# Patient Record
Sex: Female | Born: 2000
Health system: Southern US, Community
[De-identification: ages and names within clinical notes are randomized; demographics above are authoritative.]

## PROBLEM LIST (undated history)

## (undated) DIAGNOSIS — B009 Herpesviral infection, unspecified: Secondary | ICD-10-CM

## (undated) DIAGNOSIS — R070 Pain in throat: Secondary | ICD-10-CM

## (undated) DIAGNOSIS — H6690 Otitis media, unspecified, unspecified ear: Secondary | ICD-10-CM

## (undated) HISTORY — PX: WISDOM TOOTH EXTRACTION: SHX21

## (undated) HISTORY — DX: Pain in throat: R07.0

## (undated) HISTORY — DX: Otitis media, unspecified, unspecified ear: H66.90

## (undated) HISTORY — DX: Herpesviral infection, unspecified: B00.9

---
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
## 2022-01-26 NOTE — Progress Notes (Signed)
   Complete physical exam  Patient: Teresa Howell   DOB: 04/26/1997   21 y.o. Female  MRN: [id number]  Subjective:    No chief complaint on file.   Teresa Howell is a 21 y.o. female who presents today for a complete physical exam. She reports consuming a {diet types:17450} diet. {types:19826} She generally feels {DESC; WELL/FAIRLY WELL/POORLY:18703}. She reports sleeping {DESC; WELL/FAIRLY WELL/POORLY:18703}. She {does/does not:200015} have additional problems to discuss today.    Most recent fall risk assessment:    01/01/2022   10:42 AM  Fall Risk   Falls in the past year? 0  Number falls in past yr: 0  Injury with Fall? 0  Risk for fall due to : No Fall Risks  Follow up Falls evaluation completed     Most recent depression screenings:    01/01/2022   10:42 AM 11/22/2020   10:46 AM  PHQ 2/9 Scores  PHQ - 2 Score 0 0  PHQ- 9 Score 5     {VISON DENTAL STD PSA (Optional):27386}  {History (Optional):23778}  Patient Care Team: Analyse Angst, NP as PCP - General (Nurse Practitioner)   Outpatient Medications Prior to Visit  Medication Sig   fluticasone (FLONASE) 50 MCG/ACT nasal spray Place 2 sprays into both nostrils in the morning and at bedtime. After 7 days, reduce to once daily.   norgestimate-ethinyl estradiol (SPRINTEC 28) 0.25-35 MG-MCG tablet Take 1 tablet by mouth daily.   Nystatin POWD Apply liberally to affected area 2 times per day   spironolactone (ALDACTONE) 100 MG tablet Take 1 tablet (100 mg total) by mouth daily.   No facility-administered medications prior to visit.    ROS        Objective:     There were no vitals taken for this visit. {Vitals History (Optional):23777}  Physical Exam   No results found for any visits on 02/06/22. {Show previous labs (optional):23779}    Assessment & Plan:    Routine Health Maintenance and Physical Exam  Immunization History  Administered Date(s) Administered   DTaP 07/10/1999, 09/05/1999,  11/14/1999, 07/30/2000, 02/13/2004   Hepatitis A 12/10/2007, 12/15/2008   Hepatitis B 04/27/1999, 06/04/1999, 11/14/1999   HiB (PRP-OMP) 07/10/1999, 09/05/1999, 11/14/1999, 07/30/2000   IPV 07/10/1999, 09/05/1999, 05/04/2000, 02/13/2004   Influenza,inj,Quad PF,6+ Mos 03/17/2014   Influenza-Unspecified 06/16/2012   MMR 05/04/2001, 02/13/2004   Meningococcal Polysaccharide 12/15/2011   Pneumococcal Conjugate-13 07/30/2000   Pneumococcal-Unspecified 11/14/1999, 01/28/2000   Tdap 12/15/2011   Varicella 05/04/2000, 12/10/2007    Health Maintenance  Topic Date Due   HIV Screening  Never done   Hepatitis C Screening  Never done   INFLUENZA VACCINE  02/04/2022   PAP-Cervical Cytology Screening  02/06/2022 (Originally 04/25/2020)   PAP SMEAR-Modifier  02/06/2022 (Originally 04/25/2020)   TETANUS/TDAP  02/06/2022 (Originally 12/14/2021)   HPV VACCINES  Discontinued   COVID-19 Vaccine  Discontinued    Discussed health benefits of physical activity, and encouraged her to engage in regular exercise appropriate for her age and condition.  Problem List Items Addressed This Visit   None Visit Diagnoses     Annual physical exam    -  Primary   Cervical cancer screening       Need for Tdap vaccination          No follow-ups on file.     Fayetta Sorenson, NP   

## 2022-01-27 ENCOUNTER — Ambulatory Visit: Payer: Self-pay | Admitting: Medical-Surgical

## 2022-01-27 DIAGNOSIS — Z91199 Patient's noncompliance with other medical treatment and regimen due to unspecified reason: Secondary | ICD-10-CM

## 2022-01-27 DIAGNOSIS — F418 Other specified anxiety disorders: Secondary | ICD-10-CM

## 2022-03-04 ENCOUNTER — Ambulatory Visit: Payer: No Typology Code available for payment source | Admitting: Medical-Surgical

## 2022-03-20 ENCOUNTER — Encounter: Payer: Self-pay | Admitting: Medical-Surgical

## 2022-03-20 ENCOUNTER — Ambulatory Visit (INDEPENDENT_AMBULATORY_CARE_PROVIDER_SITE_OTHER): Payer: No Typology Code available for payment source | Admitting: Medical-Surgical

## 2022-03-20 ENCOUNTER — Other Ambulatory Visit (HOSPITAL_BASED_OUTPATIENT_CLINIC_OR_DEPARTMENT_OTHER): Payer: Self-pay

## 2022-03-20 VITALS — BP 93/70 | HR 89 | Resp 20 | Ht 67.49 in | Wt 224.3 lb

## 2022-03-20 DIAGNOSIS — F418 Other specified anxiety disorders: Secondary | ICD-10-CM | POA: Diagnosis not present

## 2022-03-20 DIAGNOSIS — B009 Herpesviral infection, unspecified: Secondary | ICD-10-CM

## 2022-03-20 DIAGNOSIS — Z23 Encounter for immunization: Secondary | ICD-10-CM

## 2022-03-20 MED ORDER — VALACYCLOVIR HCL 1 G PO TABS
1000.0000 mg | ORAL_TABLET | Freq: Two times a day (BID) | ORAL | 1 refills | Status: DC | PRN
  Filled 2022-03-20: qty 30, 15d supply, fill #0

## 2022-03-20 MED ORDER — ESCITALOPRAM OXALATE 10 MG PO TABS
ORAL_TABLET | ORAL | 1 refills | Status: DC
  Filled 2022-03-20: qty 30, 30d supply, fill #0

## 2022-03-20 NOTE — Progress Notes (Signed)
Established Patient Office Visit  Subjective   Patient ID: Teresa Howell, female    DOB: 2001-02-05  Age: 21 y.o. MRN: [id number]  Chief Complaint  Patient presents with   Follow-up    Teresa Howell is a very pleasant 21 year old female here today for follow up after starting her on Lexapro back in June. She stated that she has not been taking it at prescribed, she said that she took it maybe the first two weeks and sporadically afterwards. She stated it suppresses her appetite, but otherwise manageable. She said she does not feel any better or worst since last visit. At this visit, she is also asking for Valacyclovir refill for HSV flare up, she states she takes it twice daily until symptoms are resolved usually about 4 days.       Review of Systems  Constitutional: Negative.   Respiratory: Negative.    Cardiovascular: Negative.   Neurological: Negative.   Psychiatric/Behavioral:  Positive for depression.       Objective:     BP 93/70 (BP Location: Left Arm, Cuff Size: Normal)   Pulse 89   Resp 20   Ht 5' 7.49" (1.714 m)   Wt 101.7 kg   SpO2 98%   BMI 34.62 kg/m    Physical Exam Constitutional:      Appearance: Normal appearance. She is obese.  HENT:     Head: Normocephalic and atraumatic.  Cardiovascular:     Rate and Rhythm: Normal rate and regular rhythm.  Pulmonary:     Effort: Pulmonary effort is normal. No respiratory distress.  Skin:    General: Skin is warm and dry.  Neurological:     General: No focal deficit present.     Mental Status: She is alert and oriented to person, place, and time. Mental status is at baseline.  Psychiatric:        Mood and Affect: Mood normal.        Behavior: Behavior normal.        Thought Content: Thought content normal.        Judgment: Judgment normal.      No results found for any visits on 03/20/22.    The ASCVD Risk score (Arnett DK, et al., 2019) failed to calculate for the following reasons:   The 2019 ASCVD  risk score is only valid for ages 67 to 39    Assessment & Plan:   Problem List Items Addressed This Visit       Other   HSV-1 infection    Valacyclovir (Valtrex) 1000 mg, twice daily until symptom / flare up resolves.       Relevant Medications   valACYclovir (VALTREX) 1000 MG tablet   HSV-2 infection    Valacyclovir (Valtrex) 1000 mg, twice daily until symptom / flare up resolves.       Relevant Medications   valACYclovir (VALTREX) 1000 MG tablet   Depression with anxiety    We discussed with Teresa Howell at length the importance of taking medications as prescribed. She is willing to restart it and teaching on how to improve compliance was given such as setting an alarm, placing the medication where it is visible. Instructed patient that it might take at least 4-6 weeks of consistent dosing to start seeing positive effects. Teaching regarding importance of exercise was provided as well.       Relevant Medications   escitalopram (LEXAPRO) 10 MG tablet   Other Visit Diagnoses     Need for  influenza vaccination    -  Primary   Relevant Orders   Flu Vaccine QUAD 6+ mos PF IM (Fluarix Quad PF) (Completed)       Return in about 6 weeks (around 05/01/2022), or Mood / Medication Follow up.    Loretha Stapler, RN

## 2022-03-20 NOTE — Progress Notes (Signed)
See below for student documentation and medical provider attestation. ___________________________________________ Thayer Ohm, DNP, APRN, FNP-BC Primary Care and Sports Medicine [facility] [place]

## 2022-03-20 NOTE — Assessment & Plan Note (Signed)
Valacyclovir (Valtrex) 1000 mg, twice daily until symptom / flare up resolves.  

## 2022-03-20 NOTE — Assessment & Plan Note (Signed)
We discussed with [name] at length the importance of taking medications as prescribed. She is willing to restart it and teaching on how to improve compliance was given such as setting an alarm, placing the medication where it is visible. Instructed patient that it might take at least 4-6 weeks of consistent dosing to start seeing positive effects. Teaching regarding importance of exercise was provided as well.

## 2022-03-20 NOTE — Assessment & Plan Note (Signed)
Valacyclovir (Valtrex) 1000 mg, twice daily until symptom / flare up resolves.

## 2022-04-23 ENCOUNTER — Other Ambulatory Visit (HOSPITAL_BASED_OUTPATIENT_CLINIC_OR_DEPARTMENT_OTHER): Payer: Self-pay

## 2022-04-23 ENCOUNTER — Telehealth: Payer: No Typology Code available for payment source | Admitting: Nurse Practitioner

## 2022-04-23 DIAGNOSIS — J0100 Acute maxillary sinusitis, unspecified: Secondary | ICD-10-CM | POA: Diagnosis not present

## 2022-04-23 MED ORDER — AMOXICILLIN-POT CLAVULANATE 875-125 MG PO TABS
1.0000 | ORAL_TABLET | Freq: Two times a day (BID) | ORAL | 0 refills | Status: DC
  Filled 2022-04-23: qty 14, 7d supply, fill #0

## 2022-04-23 NOTE — Progress Notes (Signed)

## 2022-04-29 ENCOUNTER — Telehealth: Payer: No Typology Code available for payment source | Admitting: Physician Assistant

## 2022-04-29 DIAGNOSIS — H539 Unspecified visual disturbance: Secondary | ICD-10-CM

## 2022-04-29 DIAGNOSIS — J0110 Acute frontal sinusitis, unspecified: Secondary | ICD-10-CM

## 2022-04-29 NOTE — Progress Notes (Signed)
Because of continued and progressing symptoms despite treatment via e-visit, and vision changes, I feel your condition warrants further evaluation and I recommend that you be seen in a face to face visit.   NOTE: There will be NO CHARGE for this eVisit   If you are having a true medical emergency please call 911.      For an urgent face to face visit, [place] has seven urgent care centers for your convenience:     [facility] [place] at [facility] Get Driving Directions [phone] [place] [place], [place] [address]    [facility]) Get Driving Directions [phone] [place], [place] [address]  [facility] [place] ([place]) Get Driving Directions [phone] [address] [place] [address],  [place]  [address]  [facility] [place] [facility] - at [address] Get Driving Directions  [phone] [address] [place],  [place] [address]   [facility] Get Driving Directions [phone] [place], [place], [place] [address]   [facility] Get Driving Directions  [phone] [address] [place], [place] [address]   [facility] [place] Get Driving Directions [phone] [address], [place], [place] [address]  Your MyChart E-visit questionnaire answers were reviewed by a board certified advanced clinical practitioner to complete your personal care plan based on your specific symptoms.  Thank you for using e-Visits.

## 2022-05-05 NOTE — Progress Notes (Unsigned)
   Established Patient Office Visit  Subjective   Patient ID: Teresa Howell, female   DOB: 04/23/2001 Age: 21 y.o. MRN: [id number]   No chief complaint on file.   HPI    Objective:    There were no vitals filed for this visit.  Physical Exam   No results found for this or any previous visit (from the past 24 hour(s)).   {Labs (Optional):23779}  The ASCVD Risk score (Arnett DK, et al., 2019) failed to calculate for the following reasons:   The 2019 ASCVD risk score is only valid for ages 90 to 58   Assessment & Plan:   No problem-specific Assessment & Plan notes found for this encounter.   No follow-ups on file.  ___________________________________________ Thayer Ohm, DNP, APRN, FNP-BC Primary Care and Sports Medicine [facility]

## 2022-05-06 ENCOUNTER — Ambulatory Visit (INDEPENDENT_AMBULATORY_CARE_PROVIDER_SITE_OTHER): Payer: No Typology Code available for payment source | Admitting: Medical-Surgical

## 2022-05-06 DIAGNOSIS — F418 Other specified anxiety disorders: Secondary | ICD-10-CM

## 2022-05-06 DIAGNOSIS — Z91199 Patient's noncompliance with other medical treatment and regimen due to unspecified reason: Secondary | ICD-10-CM

## 2022-07-08 ENCOUNTER — Ambulatory Visit (INDEPENDENT_AMBULATORY_CARE_PROVIDER_SITE_OTHER): Payer: 59 | Admitting: Medical-Surgical

## 2022-07-08 ENCOUNTER — Encounter: Payer: Self-pay | Admitting: Medical-Surgical

## 2022-07-08 ENCOUNTER — Other Ambulatory Visit (HOSPITAL_BASED_OUTPATIENT_CLINIC_OR_DEPARTMENT_OTHER): Payer: Self-pay

## 2022-07-08 VITALS — BP 122/73 | HR 96 | Resp 20 | Ht 67.5 in | Wt 227.9 lb

## 2022-07-08 DIAGNOSIS — Z111 Encounter for screening for respiratory tuberculosis: Secondary | ICD-10-CM | POA: Diagnosis not present

## 2022-07-08 DIAGNOSIS — Z13220 Encounter for screening for lipoid disorders: Secondary | ICD-10-CM | POA: Diagnosis not present

## 2022-07-08 DIAGNOSIS — Z0000 Encounter for general adult medical examination without abnormal findings: Secondary | ICD-10-CM | POA: Diagnosis not present

## 2022-07-08 MED ORDER — ESCITALOPRAM OXALATE 10 MG PO TABS
10.0000 mg | ORAL_TABLET | Freq: Every day | ORAL | 1 refills | Status: DC
  Filled 2022-07-08: qty 90, 90d supply, fill #0

## 2022-07-08 NOTE — Patient Instructions (Signed)
3

## 2022-07-08 NOTE — Progress Notes (Signed)
Complete physical exam  Patient: Teresa Howell   DOB: Dec 27, 2000   22 y.o. Female  MRN: [id number]  Subjective:    Chief Complaint  Patient presents with   Annual Exam    TB Skin test for work    Teresa Howell is a 22 y.o. female who presents today for a complete physical exam. She reports consuming a general diet. The patient does not participate in regular exercise at present. She generally feels well. She reports sleeping well. She does not have additional problems to discuss today.    Most recent fall risk assessment:    03/20/2022    2:15 PM  Fall Risk   Falls in the past year? 0  Number falls in past yr: 0  Injury with Fall? 0  Risk for fall due to : No Fall Risks  Follow up Falls evaluation completed     Most recent depression screenings:    03/20/2022    2:15 PM 12/09/2021    3:18 PM  PHQ 2/9 Scores  PHQ - 2 Score 1 2  PHQ- 9 Score 7 16    Vision:Not within last year , Dental: No current dental problems and Receives regular dental care, and STD: The patient reports a past history of: herpes    Patient Care Team: Samuel Bouche, NP as PCP - General (Nurse Practitioner)   Outpatient Medications Prior to Visit  Medication Sig   levonorgestrel (KYLEENA) 19.5 MG IUD 1 each by Intrauterine route once.   meloxicam (MOBIC) 7.5 MG tablet Take 7.5 mg by mouth as needed for pain. For menstrual pain   valACYclovir (VALTREX) 1000 MG tablet Take 1 tablet (1,000 mg total) by mouth 2 (two) times daily as needed.   [DISCONTINUED] escitalopram (LEXAPRO) 10 MG tablet Take 1/2 tablet (5mg ) by mouth daily for 8 days then increase to 1 full tablet (10mg ) daily.   [DISCONTINUED] amoxicillin-clavulanate (AUGMENTIN) 875-125 MG tablet Take 1 tablet by mouth 2 (two) times daily. (Patient not taking: Reported on 07/08/2022)   No facility-administered medications prior to visit.    Review of Systems  Constitutional:  Negative for chills, fever, malaise/fatigue and weight loss.   HENT:  Negative for congestion, ear pain, hearing loss, sinus pain and sore throat.   Eyes:  Negative for blurred vision, photophobia and pain.  Respiratory:  Negative for cough, shortness of breath and wheezing.   Cardiovascular:  Negative for chest pain, palpitations and leg swelling.  Gastrointestinal:  Negative for abdominal pain, constipation, diarrhea, heartburn, nausea and vomiting.  Genitourinary:  Negative for dysuria, frequency and urgency.  Musculoskeletal:  Negative for falls and neck pain.  Skin:  Negative for itching and rash.  Neurological:  Negative for dizziness, weakness and headaches.  Endo/Heme/Allergies:  Negative for polydipsia. Does not bruise/bleed easily.  Psychiatric/Behavioral:  Negative for depression, substance abuse and suicidal ideas. The patient is not nervous/anxious.       Objective:    BP 122/73 (BP Location: Left Arm, Cuff Size: Normal)   Pulse 96   Resp 20   Ht 5' 7.5" (1.715 m)   Wt 227 lb 14.4 oz (103.4 kg)   SpO2 99%   BMI 35.17 kg/m    Physical Exam Constitutional:      General: She is not in acute distress.    Appearance: Normal appearance. She is not ill-appearing.  HENT:     Head: Normocephalic and atraumatic.     Right Ear: Tympanic membrane, ear canal and external ear normal.  There is no impacted cerumen.     Left Ear: Tympanic membrane, ear canal and external ear normal. There is no impacted cerumen.     Nose: Nose normal. No congestion or rhinorrhea.     Mouth/Throat:     Mouth: Mucous membranes are moist.     Pharynx: No oropharyngeal exudate or posterior oropharyngeal erythema.  Eyes:     General: No scleral icterus.       Right eye: No discharge.        Left eye: No discharge.     Extraocular Movements: Extraocular movements intact.     Conjunctiva/sclera: Conjunctivae normal.     Pupils: Pupils are equal, round, and reactive to light.  Neck:     Thyroid: No thyromegaly.     Vascular: No carotid bruit or JVD.      Trachea: Trachea normal.  Cardiovascular:     Rate and Rhythm: Normal rate and regular rhythm.     Pulses: Normal pulses.     Heart sounds: Normal heart sounds. No murmur heard.    No friction rub. No gallop.  Pulmonary:     Effort: Pulmonary effort is normal. No respiratory distress.     Breath sounds: Normal breath sounds. No wheezing.  Abdominal:     General: Bowel sounds are normal. There is no distension.     Palpations: Abdomen is soft.     Tenderness: There is no abdominal tenderness. There is no guarding.  Musculoskeletal:        General: Normal range of motion.     Cervical back: Normal range of motion and neck supple.  Skin:    General: Skin is warm and dry.  Neurological:     Mental Status: She is alert and oriented to person, place, and time.     Cranial Nerves: No cranial nerve deficit.  Psychiatric:        Mood and Affect: Mood normal.        Behavior: Behavior normal.        Thought Content: Thought content normal.        Judgment: Judgment normal.   No results found for any visits on 07/08/22.     Assessment & Plan:    Routine Health Maintenance and Physical Exam  Immunization History  Administered Date(s) Administered   Hepatitis A, Ped/Adol-2 Dose 03/23/2015   Influenza,inj,Quad PF,6+ Mos 07/13/2013, 08/18/2016, 04/18/2020, 03/20/2022   Influenza-Unspecified 07/13/2013, 08/18/2016, 04/03/2018   Meningococcal B, OMV 02/23/2020, 04/18/2020   Meningococcal Conjugate 03/23/2015, 02/23/2020   Tdap 03/23/2015    Health Maintenance  Topic Date Due   COVID-19 Vaccine (1) Never done   CHLAMYDIA SCREENING  Never done   DTaP/Tdap/Td (2 - Td or Tdap) 03/22/2025   INFLUENZA VACCINE  Completed   HPV VACCINES  Discontinued   Hepatitis C Screening  Discontinued   HIV Screening  Discontinued    Discussed health benefits of physical activity, and encouraged her to engage in regular exercise appropriate for her age and condition.  1. Annual physical  exam Checking labs as below.  Up-to-date on preventative care.  Wellness information provided with AVS. - CBC with Differential/Platelet - COMPLETE METABOLIC PANEL WITH GFR - Lipid panel  2. Screening-pulmonary TB Adding QuantiFERON to labs today. - QuantiFERON-TB Gold Plus  3. Lipid screening Checking lipid panel. - Lipid panel   Return in about 1 year (around 07/09/2023) for annual physical exam or sooner if needed.   Samuel Bouche, NP

## 2022-07-09 NOTE — Telephone Encounter (Signed)
Printed and given to [employer]

## 2022-07-10 LAB — CBC WITH DIFFERENTIAL/PLATELET
Absolute Monocytes: 540 cells/uL (ref 200–950)
Basophils Absolute: 51 cells/uL (ref 0–200)
Basophils Relative: 0.7 %
Eosinophils Absolute: 292 cells/uL (ref 15–500)
Eosinophils Relative: 4 %
HCT: 37.5 % (ref 35.0–45.0)
Hemoglobin: 12.6 g/dL (ref 11.7–15.5)
Lymphs Abs: 2394 cells/uL (ref 850–3900)
MCH: 29 pg (ref 27.0–33.0)
MCHC: 33.6 g/dL (ref 32.0–36.0)
MCV: 86.4 fL (ref 80.0–100.0)
MPV: 10.3 fL (ref 7.5–12.5)
Monocytes Relative: 7.4 %
Neutro Abs: 4022 cells/uL (ref 1500–7800)
Neutrophils Relative %: 55.1 %
Platelets: 365 10*3/uL (ref 140–400)
RBC: 4.34 10*6/uL (ref 3.80–5.10)
RDW: 12.2 % (ref 11.0–15.0)
Total Lymphocyte: 32.8 %
WBC: 7.3 10*3/uL (ref 3.8–10.8)

## 2022-07-10 LAB — COMPLETE METABOLIC PANEL WITH GFR
AG Ratio: 1.6 (calc) (ref 1.0–2.5)
ALT: 10 U/L (ref 5–32)
AST: 15 U/L (ref 12–32)
Albumin: 4.4 g/dL (ref 3.6–5.1)
Alkaline phosphatase (APISO): 72 U/L (ref 36–128)
BUN: 9 mg/dL (ref 7–20)
CO2: 28 mmol/L (ref 20–32)
Calcium: 8.8 mg/dL — ABNORMAL LOW (ref 8.9–10.4)
Chloride: 104 mmol/L (ref 98–110)
Creat: 0.73 mg/dL (ref 0.50–0.96)
Globulin: 2.8 g/dL (calc) (ref 2.0–3.8)
Glucose, Bld: 82 mg/dL (ref 65–99)
Potassium: 4 mmol/L (ref 3.8–5.1)
Sodium: 140 mmol/L (ref 135–146)
Total Bilirubin: 0.4 mg/dL (ref 0.2–1.1)
Total Protein: 7.2 g/dL (ref 6.3–8.2)
eGFR: 121 mL/min/{1.73_m2} (ref >=60)

## 2022-07-10 LAB — QUANTIFERON-TB GOLD PLUS
Mitogen-NIL: >10 IU/mL
NIL: 0.01 IU/mL
QuantiFERON-TB Gold Plus: NEGATIVE
TB1-NIL: 0 IU/mL
TB2-NIL: 0 IU/mL

## 2022-07-10 LAB — LIPID PANEL
Cholesterol: 160 mg/dL (ref <170)
HDL: 61 mg/dL (ref >45)
LDL Cholesterol (Calc): 82 mg/dL (calc) (ref <110)
Non-HDL Cholesterol (Calc): 99 mg/dL (calc) (ref <120)
Total CHOL/HDL Ratio: 2.6 (calc) (ref <5.0)
Triglycerides: 89 mg/dL (ref <90)

## 2022-07-10 NOTE — Telephone Encounter (Signed)
Printed TB lab results and attached to the form.   LMVM and sent message through MyChart advising the patient that her form is ready for pick up at the front desk.

## 2022-07-10 NOTE — Progress Notes (Signed)
Printed out QuantiFERON-TB  Gold Plus results and attached to completed form.  LMVM and sent a message through MyChart advising the patient that her form is ready for pick up.

## 2022-07-17 ENCOUNTER — Other Ambulatory Visit (HOSPITAL_BASED_OUTPATIENT_CLINIC_OR_DEPARTMENT_OTHER): Payer: Self-pay

## 2022-12-10 ENCOUNTER — Telehealth: Payer: 59 | Admitting: Nurse Practitioner

## 2022-12-10 ENCOUNTER — Other Ambulatory Visit (HOSPITAL_BASED_OUTPATIENT_CLINIC_OR_DEPARTMENT_OTHER): Payer: Self-pay

## 2022-12-10 DIAGNOSIS — R112 Nausea with vomiting, unspecified: Secondary | ICD-10-CM

## 2022-12-10 MED ORDER — ONDANSETRON 4 MG PO TBDP
4.0000 mg | ORAL_TABLET | Freq: Three times a day (TID) | ORAL | 0 refills | Status: DC | PRN
  Filled 2022-12-10: qty 20, 7d supply, fill #0

## 2022-12-10 NOTE — Progress Notes (Signed)
E-Visit for Nausea and Vomiting   We are sorry that you are not feeling well. Here is how we plan to help!  Based on what you have shared with me it looks like you have a Virus that is irritating your GI tract.  Vomiting is the forceful emptying of a portion of the stomach's content through the mouth.  Although nausea and vomiting can make you feel miserable, it's important to remember that these are not diseases, but rather symptoms of an underlying illness.  When we treat short term symptoms, we always caution that any symptoms that persist should be fully evaluated in a medical office.  I have prescribed a medication that will help alleviate your symptoms and allow you to stay hydrated:  Zofran 4 mg 1 tablet every 8 hours as needed for nausea and vomiting  HOME CARE: Drink clear liquids.  This is very important! Dehydration (the lack of fluid) can lead to a serious complication.  Start off with 1 tablespoon every 5 minutes for 8 hours. You may begin eating bland foods after 8 hours without vomiting.  Start with saltine crackers, white bread, rice, mashed potatoes, applesauce. After 48 hours on a bland diet, you may resume a normal diet. Try to go to sleep.  Sleep often empties the stomach and relieves the need to vomit.  GET HELP RIGHT AWAY IF:  Your symptoms do not improve or worsen within 2 days after treatment. You have a fever for over 3 days. You cannot keep down fluids after trying the medication.  MAKE SURE YOU:  Understand these instructions. Will watch your condition. Will get help right away if you are not doing well or get worse.    Thank you for choosing an e-visit.  Your e-visit answers were reviewed by a board certified advanced clinical practitioner to complete your personal care plan. Depending upon the condition, your plan could have included both over the counter or prescription medications.  Please review your pharmacy choice. Make sure the pharmacy is open so  you can pick up prescription now. If there is a problem, you may contact your provider through MyChart messaging and have the prescription routed to another pharmacy.  Your safety is important to us. If you have drug allergies check your prescription carefully.   For the next 24 hours you can use MyChart to ask questions about today's visit, request a non-urgent call back, or ask for a work or school excuse. You will get an email in the next two days asking about your experience. I hope that your e-visit has been valuable and will speed your recovery.   Meds ordered this encounter  Medications   ondansetron (ZOFRAN-ODT) 4 MG disintegrating tablet    Sig: Take 1 tablet (4 mg total) by mouth every 8 (eight) hours as needed for nausea or vomiting.    Dispense:  20 tablet    Refill:  0    I spent approximately 5 minutes reviewing the patient's history, current symptoms and coordinating their care today.   

## 2022-12-31 ENCOUNTER — Encounter: Payer: Self-pay | Admitting: Medical-Surgical

## 2022-12-31 ENCOUNTER — Other Ambulatory Visit (HOSPITAL_BASED_OUTPATIENT_CLINIC_OR_DEPARTMENT_OTHER): Payer: Self-pay

## 2022-12-31 ENCOUNTER — Ambulatory Visit (INDEPENDENT_AMBULATORY_CARE_PROVIDER_SITE_OTHER): Payer: 59 | Admitting: Medical-Surgical

## 2022-12-31 VITALS — BP 101/64 | HR 86 | Resp 20 | Ht 67.51 in | Wt 200.0 lb

## 2022-12-31 DIAGNOSIS — R197 Diarrhea, unspecified: Secondary | ICD-10-CM

## 2022-12-31 DIAGNOSIS — Z3200 Encounter for pregnancy test, result unknown: Secondary | ICD-10-CM | POA: Diagnosis not present

## 2022-12-31 DIAGNOSIS — Z118 Encounter for screening for other infectious and parasitic diseases: Secondary | ICD-10-CM | POA: Diagnosis not present

## 2022-12-31 LAB — POCT URINE PREGNANCY: Preg Test, Ur: NEGATIVE

## 2022-12-31 MED ORDER — ONDANSETRON HCL 4 MG PO TABS
4.0000 mg | ORAL_TABLET | Freq: Three times a day (TID) | ORAL | 0 refills | Status: DC | PRN
  Filled 2022-12-31: qty 20, 7d supply, fill #0

## 2022-12-31 NOTE — Progress Notes (Signed)
        Established patient visit  History, exam, impression, and plan:  1. Diarrhea of presumed infectious origin Pleasant 22 year old female presenting today with reports of approximately 10 days of diarrhea.  Her symptoms started abruptly with loose watery diarrhea.  Endorses approximately 6 bowel movements per day however this has improved and is now having only about 2/day.  Several days ago, she developed nausea as well as an associated headache.  A couple of days ago, she woke up vomiting.  Admits to not drinking a lot of water on a regular basis.  Appetite has been decreased due to nausea.  No abdominal cramping, fevers, chills, abnormal stool odor, hematochezia, melena, hematemesis, fever, chills, bloating, or excessive gassiness.  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  Since this is lasted for about a week and a half, she would like to have further evaluation for infectious causes of her symptoms.  Ordering labs as below for evaluation of stool.  As she was having nausea and is currently sexually active, she did request a pregnancy test today.  It was negative which was reassuring.  Notes that she was worried despite having an IUD because her mom also got pregnant while an IUD was in place.  Ultimately, it does seem her symptoms are gradually improving.  Feel that the diarrhea is going to continue to improve.  Adding Zofran as needed for nausea.  Headache and nausea/vomiting likely related to dehydration.  Recommend aggressive hydration and working to eat small frequent meals starting with bland foods and increasing as tolerated. - Ova and parasite examination - C. difficile GDH and Toxin A/B - Salmonella/Shigella Cult, Campy EIA and Shiga Toxin reflex - Fecal lactoferrin, quant - Giardia antigen  2. Screening for chlamydial disease Currently sexually active but denies concern for STDs.  Sending urine for chlamydia and gonorrhea evaluation. - C.  trachomatis/N. gonorrhoeae RNA   Physical Exam Vitals reviewed.  Constitutional:      General: She is not in acute distress.    Appearance: Normal appearance.  HENT:     Head: Normocephalic and atraumatic.  Cardiovascular:     Rate and Rhythm: Normal rate and regular rhythm.     Pulses: Normal pulses.     Heart sounds: Normal heart sounds. No murmur heard.    No friction rub. No gallop.  Pulmonary:     Effort: Pulmonary effort is normal. No respiratory distress.     Breath sounds: Normal breath sounds. No wheezing.  Abdominal:     General: Bowel sounds are normal. There is no distension.     Palpations: Abdomen is soft. There is no mass.     Tenderness: There is abdominal tenderness (Mild diffuse). There is no guarding or rebound.  Skin:    General: Skin is warm and dry.  Neurological:     Mental Status: She is alert and oriented to person, place, and time.  Psychiatric:        Mood and Affect: Mood normal.        Behavior: Behavior normal.        Thought Content: Thought content normal.        Judgment: Judgment normal.   Procedures performed this visit: None.  Return if symptoms worsen or fail to improve.  __________________________________ Thayer Ohm, DNP, APRN, FNP-BC Primary Care and Sports Medicine [facility]

## 2023-01-01 DIAGNOSIS — R197 Diarrhea, unspecified: Secondary | ICD-10-CM | POA: Diagnosis not present

## 2023-01-01 LAB — C. TRACHOMATIS/N. GONORRHOEAE RNA
C. trachomatis RNA, TMA: NOT DETECTED
N. gonorrhoeae RNA, TMA: NOT DETECTED

## 2023-01-02 ENCOUNTER — Encounter: Payer: Self-pay | Admitting: Medical-Surgical

## 2023-01-02 LAB — C. DIFFICILE GDH AND TOXIN A/B
MICRO NUMBER:: 15140014
SPECIMEN QUALITY:: ADEQUATE
TOXIN A AND B: NOT DETECTED

## 2023-01-03 LAB — SALMONELLA/SHIGELLA CULT, CAMPY EIA AND SHIGA TOXIN RFL ECOLI: SPECIMEN QUALITY:: ADEQUATE

## 2023-01-03 LAB — C. DIFFICILE GDH AND TOXIN A/B

## 2023-01-05 LAB — SALMONELLA/SHIGELLA CULT, CAMPY EIA AND SHIGA TOXIN RFL ECOLI
MICRO NUMBER:: 15139372
SHIGA RESULT:: NOT DETECTED

## 2023-01-05 LAB — GIARDIA ANTIGEN: RESULT:: NOT DETECTED

## 2023-01-05 LAB — C. DIFFICILE GDH AND TOXIN A/B: GDH ANTIGEN: NOT DETECTED

## 2023-01-09 LAB — OVA AND PARASITE EXAMINATION
CONCENTRATE RESULT:: NONE SEEN
MICRO NUMBER:: 15138680
SPECIMEN QUALITY:: ADEQUATE
TRICHROME RESULT:: NONE SEEN

## 2023-01-09 LAB — GIARDIA ANTIGEN
MICRO NUMBER:: 15139444
SPECIMEN QUALITY:: ADEQUATE

## 2023-01-09 LAB — SALMONELLA/SHIGELLA CULT, CAMPY EIA AND SHIGA TOXIN RFL ECOLI
MICRO NUMBER: 15139371
MICRO NUMBER:: 15139373
SPECIMEN QUALITY:: ADEQUATE
SPECIMEN QUALITY:: ADEQUATE

## 2023-03-25 ENCOUNTER — Telehealth: Payer: 59 | Admitting: Physician Assistant

## 2023-03-25 DIAGNOSIS — R3989 Other symptoms and signs involving the genitourinary system: Secondary | ICD-10-CM | POA: Diagnosis not present

## 2023-03-25 MED ORDER — NITROFURANTOIN MONOHYD MACRO 100 MG PO CAPS
100.0000 mg | ORAL_CAPSULE | Freq: Two times a day (BID) | ORAL | 0 refills | Status: AC
Start: 2023-03-25 — End: 2023-03-31
  Filled 2023-03-25: qty 10, 5d supply, fill #0

## 2023-03-25 NOTE — Progress Notes (Signed)
E-Visit for Urinary Problems  We are sorry that you are not feeling well.  Here is how we plan to help!  Based on what you shared with me it looks like you most likely have a simple urinary tract infection.  A UTI (Urinary Tract Infection) is a bacterial infection of the bladder.  Most cases of urinary tract infections are simple to treat but a key part of your care is to encourage you to drink plenty of fluids and watch your symptoms carefully.  I have prescribed MacroBid 100 mg twice a day for 5 days.  Your symptoms should gradually improve. Call us if the burning in your urine worsens, you develop worsening fever, back pain or pelvic pain or if your symptoms do not resolve after completing the antibiotic.  Urinary tract infections can be prevented by drinking plenty of water to keep your body hydrated.  Also be sure when you wipe, wipe from front to back and don't hold it in!  If possible, empty your bladder every 4 hours.  HOME CARE Drink plenty of fluids Compete the full course of the antibiotics even if the symptoms resolve Remember, when you need to go.go. Holding in your urine can increase the likelihood of getting a UTI! GET HELP RIGHT AWAY IF: You cannot urinate You get a high fever Worsening back pain occurs You see blood in your urine You feel sick to your stomach or throw up You feel like you are going to pass out  MAKE SURE YOU  Understand these instructions. Will watch your condition. Will get help right away if you are not doing well or get worse.   Thank you for choosing an e-visit.  Your e-visit answers were reviewed by a board certified advanced clinical practitioner to complete your personal care plan. Depending upon the condition, your plan could have included both over the counter or prescription medications.  Please review your pharmacy choice. Make sure the pharmacy is open so you can pick up prescription now. If there is a problem, you may contact your  provider through [employer] and have the prescription routed to another pharmacy.  Your safety is important to [place]. If you have drug allergies check your prescription carefully.   For the next 24 hours you can use MyChart to ask questions about today's visit, request a non-urgent call back, or ask for a work or school excuse. You will get an email in the next two days asking about your experience. I hope that your e-visit has been valuable and will speed your recovery.   I have spent 5 minutes in review of e-visit questionnaire, review and updating patient chart, medical decision making and response to patient.   Gilberto Better, PA-C

## 2023-03-26 ENCOUNTER — Other Ambulatory Visit (HOSPITAL_BASED_OUTPATIENT_CLINIC_OR_DEPARTMENT_OTHER): Payer: Self-pay

## 2023-03-27 ENCOUNTER — Encounter: Payer: Self-pay | Admitting: Medical-Surgical

## 2023-06-08 DIAGNOSIS — Z113 Encounter for screening for infections with a predominantly sexual mode of transmission: Secondary | ICD-10-CM | POA: Diagnosis not present

## 2023-06-08 DIAGNOSIS — Z01419 Encounter for gynecological examination (general) (routine) without abnormal findings: Secondary | ICD-10-CM | POA: Diagnosis not present

## 2023-06-08 DIAGNOSIS — Z6833 Body mass index (BMI) 33.0-33.9, adult: Secondary | ICD-10-CM | POA: Diagnosis not present

## 2023-06-25 ENCOUNTER — Encounter: Payer: Self-pay | Admitting: Medical-Surgical

## 2023-06-26 ENCOUNTER — Telehealth: Payer: 59 | Admitting: Medical-Surgical

## 2023-06-26 ENCOUNTER — Encounter: Payer: Self-pay | Admitting: Medical-Surgical

## 2023-06-26 ENCOUNTER — Other Ambulatory Visit (HOSPITAL_BASED_OUTPATIENT_CLINIC_OR_DEPARTMENT_OTHER): Payer: Self-pay

## 2023-06-26 DIAGNOSIS — F419 Anxiety disorder, unspecified: Secondary | ICD-10-CM | POA: Diagnosis not present

## 2023-06-26 MED ORDER — SERTRALINE HCL 50 MG PO TABS
ORAL_TABLET | ORAL | 3 refills | Status: DC
  Filled 2023-06-26 – 2023-07-31 (×2): qty 30, 34d supply, fill #0

## 2023-06-26 NOTE — Progress Notes (Signed)
Virtual Visit via Video Note  I connected with Teresa Howell on 06/26/23 at  4:00 PM EST by a video enabled telemedicine application and verified that I am speaking with the correct person using two identifiers.   I discussed the limitations of evaluation and management by telemedicine and the availability of in person appointments. The patient expressed understanding and agreed to proceed.  Patient location: home Provider locations: office  Subjective:    CC: Nausea/vomiting  HPI: Pleasant 22 year old female presenting via MyChart video visit with reports of several days of episodes that include nausea, vomiting, and shakiness.  Notes that she has had a lot of family issues going on recently and her anxiety but has been higher.  On Tuesday, CPS called them to say they needed to speak with them and set up an appointment.  That night, she could not sleep and was nauseated with vomiting and shakiness all the next day.  On Thursday, she had to miss work so they could go and file information with the court.  That night, she was told they would have to be in court this morning.  She again experienced the nausea, vomiting, and shakiness today.  Now that the court meeting has occurred and things are getting a little more settled, she reports her symptoms are completely gone and she is now feeling hungry and back to her normal self.  Thinks this may be related to anxiety.  Previously prescribed Lexapro for anxiety but reports that she never felt that it did a great job of controlling her symptoms.  She stopped taking it on her own and is currently unmedicated.  She is now open to considering a different medication to help with her symptoms.  Past medical history, Surgical history, Family history not pertinant except as noted below, Social history, Allergies, and medications have been entered into the medical record, reviewed, and corrections made.   Review of Systems: See HPI for pertinent positives and  negatives.   Objective:    General: Speaking clearly in complete sentences without any shortness of breath.  Alert and oriented x3.  Normal judgment. No apparent acute distress.  Impression and Recommendations:    1. Anxiety (Primary) Symptoms noted above do appear related to severe anxiety/panic.  She is now completely asymptomatic so suspect this is very situational.  After review, she notes that her mom takes sertraline and does very well with it.  Starting sertraline 25 mg daily for 7 days then increase to 50 mg daily.  Plan to follow-up closely in 4 weeks to evaluate tolerance and response to the medication.   I discussed the assessment and treatment plan with the patient. The patient was provided an opportunity to ask questions and all were answered. The patient agreed with the plan and demonstrated an understanding of the instructions.   The patient was advised to call back or seek an in-person evaluation if the symptoms worsen or if the condition fails to improve as anticipated.  Return in about 4 weeks (around 07/24/2023) for mood follow up.  Thayer Ohm, DNP, APRN, FNP-BC [facility]

## 2023-07-07 ENCOUNTER — Other Ambulatory Visit (HOSPITAL_BASED_OUTPATIENT_CLINIC_OR_DEPARTMENT_OTHER): Payer: Self-pay

## 2023-07-31 ENCOUNTER — Ambulatory Visit: Payer: 59 | Admitting: Medical-Surgical

## 2023-07-31 ENCOUNTER — Other Ambulatory Visit (HOSPITAL_BASED_OUTPATIENT_CLINIC_OR_DEPARTMENT_OTHER): Payer: Self-pay

## 2023-07-31 ENCOUNTER — Encounter: Payer: Self-pay | Admitting: Medical-Surgical

## 2023-07-31 VITALS — BP 125/69 | HR 89 | Temp 98.2°F | Resp 20 | Ht 67.51 in | Wt 192.9 lb

## 2023-07-31 DIAGNOSIS — R059 Cough, unspecified: Secondary | ICD-10-CM

## 2023-07-31 DIAGNOSIS — J069 Acute upper respiratory infection, unspecified: Secondary | ICD-10-CM | POA: Diagnosis not present

## 2023-07-31 DIAGNOSIS — J029 Acute pharyngitis, unspecified: Secondary | ICD-10-CM

## 2023-07-31 DIAGNOSIS — A084 Viral intestinal infection, unspecified: Secondary | ICD-10-CM | POA: Diagnosis not present

## 2023-07-31 LAB — POCT INFLUENZA A/B
Influenza A, POC: NEGATIVE
Influenza B, POC: NEGATIVE

## 2023-07-31 LAB — POCT RAPID STREP A (OFFICE): Rapid Strep A Screen: NEGATIVE

## 2023-07-31 MED ORDER — ONDANSETRON HCL 4 MG PO TABS
4.0000 mg | ORAL_TABLET | Freq: Three times a day (TID) | ORAL | 1 refills | Status: DC | PRN
Start: 2023-07-31 — End: 2024-03-29
  Filled 2023-07-31: qty 20, 7d supply, fill #0

## 2023-07-31 NOTE — Progress Notes (Unsigned)
        Established patient visit  History, exam, impression, and plan:  1. Viral gastroenteritis (Primary) 2. Viral URI with cough Pleasant 23 year old female presenting today with reports of numerous symptoms that developed on Monday of this week.  Originally started out with left eye discharge and crusting on Monday and Tuesday.  She felt okay overall however noted that her left eye was red.  On Wednesday, she reports developing nausea as well as a mild cough.  Her mom had some old antibiotic eyedrops at the house which she gave her to use on her eye.  Noticed that the antibiotic eyedrops worked and her eye symptoms resolved.  On Thursday, she developed significant fatigue and mild rhinorrhea in addition to the coughing and nausea.  On Friday, she developed diarrhea and worsening nausea.  She works in the [employer] and is often exposed to sick individuals.  Since Monday, has had temperature fluctuations.  She was COVID-negative this morning but was not able to test herself for flu at work.  On exam, she does have some clear rhinorrhea as well as mild posterior oropharyngeal erythema.  Abdominal exam shows mildly hyperactive bowel sounds without significant tenderness.  POCT flu and strep both negative here in the office.  Suspect simple viral URI with viral gastroenteritis.  Plan for symptomatic treatment and conservative measures at home.  Adding Zofran for nausea as needed.  Work note provided at patient request. - ondansetron (ZOFRAN) 4 MG tablet; Take 1 tablet (4 mg total) by mouth every 8 (eight) hours as needed for nausea or vomiting.  Dispense: 20 tablet; Refill: 1  Procedures performed this visit: None.  Return if symptoms worsen or fail to improve.  __________________________________ Thayer Ohm, DNP, APRN, FNP-BC Primary Care and Sports Medicine [facility] [place]

## 2023-08-11 ENCOUNTER — Encounter: Payer: Self-pay | Admitting: Medical-Surgical

## 2023-08-11 ENCOUNTER — Ambulatory Visit: Payer: 59 | Admitting: Medical-Surgical

## 2023-08-11 ENCOUNTER — Other Ambulatory Visit (HOSPITAL_BASED_OUTPATIENT_CLINIC_OR_DEPARTMENT_OTHER): Payer: Self-pay

## 2023-08-11 VITALS — BP 110/73 | HR 94 | Temp 97.9°F | Resp 20 | Ht 67.51 in | Wt 195.9 lb

## 2023-08-11 DIAGNOSIS — J029 Acute pharyngitis, unspecified: Secondary | ICD-10-CM | POA: Diagnosis not present

## 2023-08-11 DIAGNOSIS — N926 Irregular menstruation, unspecified: Secondary | ICD-10-CM | POA: Diagnosis not present

## 2023-08-11 LAB — POCT URINE PREGNANCY: Preg Test, Ur: NEGATIVE

## 2023-08-11 LAB — POCT RAPID STREP A (OFFICE): Rapid Strep A Screen: POSITIVE — AB

## 2023-08-11 MED ORDER — AMOXICILLIN 500 MG PO CAPS
500.0000 mg | ORAL_CAPSULE | Freq: Two times a day (BID) | ORAL | 0 refills | Status: AC
  Filled 2023-08-11: qty 20, 10d supply, fill #0

## 2023-08-11 NOTE — Progress Notes (Signed)
        Established patient visit  History, exam, impression, and plan:  1. Sore throat (Primary) Pleasant 23 year old female presenting today with complaints of 6 days of fever, sore throat, painful swallowing, and body/head aches. Has been exposed to several illnesses through her job and at home. Has not been seen anywhere or completed any home testing. Did not have a thermometer at home to check her temp but did feel feverish with occasional chills. Has been taking OTCs with minimal results. On exam, tonsils enlarged, very erythematous with white exudate. Strep A positive on POC testing. Declined flu/covid testing. Treating with Amoxicillin  500mg  BID x 10 days. Ok to use Tylenol/Ibuprofen  or other OTC symptoms relief as needed.  - POCT rapid strep A  2. Irregular menses Has had some irregularities with her menses over the past few months and is very worried about possible pregnancy. UPT negative today.  - POCT urine pregnancy  Procedures performed this visit: None.  Return if symptoms worsen or fail to improve.  __________________________________ Zada FREDRIK Palin, DNP, APRN, FNP-BC Primary Care and Sports Medicine [facility] [place]

## 2023-09-27 ENCOUNTER — Encounter: Payer: Self-pay | Admitting: Medical-Surgical

## 2023-09-29 ENCOUNTER — Telehealth (INDEPENDENT_AMBULATORY_CARE_PROVIDER_SITE_OTHER): Admitting: Medical-Surgical

## 2023-09-29 ENCOUNTER — Encounter: Payer: Self-pay | Admitting: Medical-Surgical

## 2023-09-29 DIAGNOSIS — A059 Bacterial foodborne intoxication, unspecified: Secondary | ICD-10-CM

## 2023-09-29 NOTE — Progress Notes (Signed)
 Virtual Visit via Video Note  I connected with Teresa Howell on 09/29/23 at  9:10 AM EDT by a video enabled telemedicine application and verified that I am speaking with the correct person using two identifiers.   I discussed the limitations of evaluation and management by telemedicine and the availability of in person appointments. The patient expressed understanding and agreed to proceed.  Patient location: Personal vehicle Provider locations: office  Subjective:    CC: Vomiting  HPI: Pleasant 23 year old female presenting today with reports of vomiting that occurred over a 24-36-hour period.  On Saturday evening, she spent time with her sister and they ended up eating out at their favorite [place].  After that, they stopped by sheets and got some snacks to take home.  Her sister wanted her to try one of the cream puffs that they brought home.  After eating the cream puff, she felt like her stomach was unsettled but this was mild.  She went to bed and woke up at 5 AM vomiting.  After vomiting, she felt better and went back to sleep.  At 7 AM she again woke up vomiting.  She was unable to call out of work because she was already at the start of her shift.  She went to work but vomited 3-4 more times before she ended up going home at 9 AM.  Notes vomiting twice more when she got home before she finally took a Zofran and settle down to sleep the rest of the day and night.  When she got up on Monday, she did not have any more vomiting but notes that she developed diarrhea.  She also had a pounding headache throughout Monday but this has resolved.  Reports that she still has diarrhea.  She is eating and drinking without difficulty.  No further nausea or vomiting.  No hematochezia or melena.  Missed work on Sunday and would like a work note.  Plans to return to work tomorrow as regularly scheduled.  Past medical history, Surgical history, Family history not pertinant except as noted below,  Social history, Allergies, and medications have been entered into the medical record, reviewed, and corrections made.   Review of Systems: See HPI for pertinent positives and negatives.   Objective:    General: Speaking clearly in complete sentences without any shortness of breath.  Alert and oriented x3.  Normal judgment. No apparent acute distress.  Impression and Recommendations:    1. Food poisoning (Primary) Given the symptoms that she describes today, feel that this was more related to food poisoning rather than a viral GI bug.  Her symptoms have resolved outside of mild diarrhea so recommend continuing conservative treatment.  Okay to use Zofran as needed for nausea.  She has this at home.  Work note sent via MyChart for her time missed on Sunday.  I discussed the assessment and treatment plan with the patient. The patient was provided an opportunity to ask questions and all were answered. The patient agreed with the plan and demonstrated an understanding of the instructions.   The patient was advised to call back or seek an in-person evaluation if the symptoms worsen or if the condition fails to improve as anticipated.  Return if symptoms worsen or fail to improve.  Thayer Ohm, DNP, APRN, FNP-BC [facility]

## 2023-09-30 ENCOUNTER — Telehealth: Payer: Self-pay | Admitting: Medical-Surgical

## 2023-09-30 NOTE — Telephone Encounter (Signed)
 Called patient left vm to call back and let us know if she received the letter in [place]

## 2023-09-30 NOTE — Telephone Encounter (Signed)
 Letter was typed and sent via MyChart yesterday evening after 5pm. Please confirm that she got it.  Thanks, [name]

## 2023-09-30 NOTE — Telephone Encounter (Unsigned)
 Copied from CRM [id number]. Topic: General - Other >> Sep 29, 2023  4:57 PM Nila Nephew wrote: Reason for CRM: Patient is calling to request that a doctor's note be typed and submitted ASAP. Patient requesting it be sent to MyChart ASAP.

## 2023-09-30 NOTE — Telephone Encounter (Signed)
 Copied from CRM [id number]. Topic: General - Other >> Sep 29, 2023  4:57 PM Nila Nephew wrote: Reason for CRM: Patient is calling to request that a doctor's note be typed and submitted ASAP. Patient requesting it be sent to MyChart ASAP.

## 2023-10-31 ENCOUNTER — Telehealth: Admitting: Family Medicine

## 2023-10-31 DIAGNOSIS — J019 Acute sinusitis, unspecified: Secondary | ICD-10-CM

## 2023-10-31 MED ORDER — AMOXICILLIN-POT CLAVULANATE 875-125 MG PO TABS: 1.0000 | ORAL_TABLET | Freq: Two times a day (BID) | ORAL | 0 refills | Status: AC

## 2023-10-31 NOTE — Progress Notes (Signed)

## 2024-02-02 ENCOUNTER — Telehealth: Admitting: Physician Assistant

## 2024-02-02 DIAGNOSIS — B9789 Other viral agents as the cause of diseases classified elsewhere: Secondary | ICD-10-CM

## 2024-02-02 DIAGNOSIS — J019 Acute sinusitis, unspecified: Secondary | ICD-10-CM

## 2024-02-02 MED ORDER — IPRATROPIUM BROMIDE 0.03 % NA SOLN: 2.0000 | Freq: Two times a day (BID) | NASAL | 0 refills | Status: DC

## 2024-02-02 NOTE — Progress Notes (Signed)

## 2024-02-02 NOTE — Progress Notes (Signed)
 I have spent 5 minutes in review of e-visit questionnaire, review and updating patient chart, medical decision making and response to patient.   Piedad Climes, PA-C

## 2024-03-02 ENCOUNTER — Encounter: Payer: Self-pay | Admitting: Medical-Surgical

## 2024-03-15 ENCOUNTER — Other Ambulatory Visit (HOSPITAL_COMMUNITY): Payer: Self-pay

## 2024-03-15 ENCOUNTER — Other Ambulatory Visit (HOSPITAL_BASED_OUTPATIENT_CLINIC_OR_DEPARTMENT_OTHER): Payer: Self-pay

## 2024-03-15 MED ORDER — IBUPROFEN 800 MG PO TABS
800.0000 mg | ORAL_TABLET | Freq: Three times a day (TID) | ORAL | 0 refills | Status: DC | PRN
  Filled 2024-03-15: qty 12, 4d supply, fill #0
  Filled 2024-03-15: qty 3, 1d supply, fill #0

## 2024-03-15 MED ORDER — AMOXICILLIN-POT CLAVULANATE 875-125 MG PO TABS
1.0000 | ORAL_TABLET | Freq: Two times a day (BID) | ORAL | 0 refills | Status: DC
  Filled 2024-03-15: qty 14, 7d supply, fill #0

## 2024-03-15 MED ORDER — AMOXICILLIN-POT CLAVULANATE 875-125 MG PO TABS
1.0000 | ORAL_TABLET | Freq: Two times a day (BID) | ORAL | 0 refills | Status: DC
  Filled 2024-03-15 (×2): qty 14, 7d supply, fill #0

## 2024-03-22 ENCOUNTER — Telehealth: Payer: Self-pay

## 2024-03-22 NOTE — Telephone Encounter (Signed)
 Copied from CRM [id number]. Topic: Clinical - Medical Advice >> Mar 22, 2024 11:34 AM Corin V wrote: Reason for CRM: Patient is wanting to confirm if she has had a Hep B vaccine. Please advise if she has. Call back: [phone]

## 2024-03-22 NOTE — Telephone Encounter (Signed)
 Copied from CRM [id number]. Topic: Clinical - Lab/Test Results >> Mar 22, 2024 11:36 AM Corin V wrote: Reason for CRM: Patient is requesting the TB test results from last test be uploaded to MyChart. Please upload and advise when done.

## 2024-03-22 NOTE — Telephone Encounter (Signed)
 Per [facility]  ( verified date of birth and address on [facility] with patient)  Shows  Hep B vaccines given 04[birth year]2003 03/08/2003 09/15/2003  Patient informed via telephone call that she had received the Hep B series.  She refused the copy of the [facility] registry immunization history  or dates of vaccines given at time of call.

## 2024-03-23 ENCOUNTER — Encounter: Payer: Self-pay | Admitting: Medical-Surgical

## 2024-03-23 ENCOUNTER — Ambulatory Visit: Payer: Self-pay

## 2024-03-23 ENCOUNTER — Telehealth: Admitting: Physician Assistant

## 2024-03-23 DIAGNOSIS — B9789 Other viral agents as the cause of diseases classified elsewhere: Secondary | ICD-10-CM | POA: Diagnosis not present

## 2024-03-23 DIAGNOSIS — J019 Acute sinusitis, unspecified: Secondary | ICD-10-CM | POA: Diagnosis not present

## 2024-03-23 MED ORDER — FLUTICASONE PROPIONATE 50 MCG/ACT NA SUSP: 2.0000 | Freq: Every day | NASAL | 0 refills | Status: DC

## 2024-03-23 NOTE — Progress Notes (Signed)
 Virtual Visit Consent   Teresa Howell, you are scheduled for a virtual visit with a [facility] provider today. Just as with appointments in the office, your consent must be obtained to participate. Your consent will be active for this visit and any virtual visit you may have with one of our providers in the next 365 days. If you have a MyChart account, a copy of this consent can be sent to you electronically.  As this is a virtual visit, video technology does not allow for your provider to perform a traditional examination. This may limit your provider's ability to fully assess your condition. If your provider identifies any concerns that need to be evaluated in person or the need to arrange testing (such as labs, EKG, etc.), we will make arrangements to do so. Although advances in technology are sophisticated, we cannot ensure that it will always work on either your end or our end. If the connection with a video visit is poor, the visit may have to be switched to a telephone visit. With either a video or telephone visit, we are not always able to ensure that we have a secure connection.  By engaging in this virtual visit, you consent to the provision of healthcare and authorize for your insurance to be billed (if applicable) for the services provided during this visit. Depending on your insurance coverage, you may receive a charge related to this service.  I need to obtain your verbal consent now. Are you willing to proceed with your visit today? Teresa Howell has provided verbal consent on 03/23/2024 for a virtual visit (video or telephone). Delon CHRISTELLA Dickinson, PA-C  Date: 03/23/2024 12:57 PM   Virtual Visit via Video Note   I, Delon CHRISTELLA Dickinson, connected with  Teresa Howell  ([id number], [birth year]) on 03/23/24 at 12:45 PM EDT by a video-enabled telemedicine application and verified that I am speaking with the correct person using two identifiers.  Location: Patient: Virtual Visit  Location Patient: Home Provider: Virtual Visit Location Provider: Home Office   I discussed the limitations of evaluation and management by telemedicine and the availability of in person appointments. The patient expressed understanding and agreed to proceed.    History of Present Illness: Teresa Howell is a 23 y.o. who identifies as a female who was assigned female at birth, and is being seen today for sinus congestion.  HPI: Sinusitis This is a new problem. The current episode started in the past 7 days (3 days; Started Sunday night, 03/20/24). The problem has been gradually worsening since onset. Maximum temperature: low grade Monday. The pain is mild. Associated symptoms include congestion, coughing, headaches and sinus pressure. Pertinent negatives include no chills, diaphoresis, ear pain, hoarse voice, sneezing or sore throat. Past treatments include antibiotics (Augmentin  for separate issue-bit at work by a child). The treatment provided no relief.     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   Hidradenitis suppurativa 03/22/2020   Abdominal pain, chronic, right lower quadrant 07/17/2013    Allergies: No Known Allergies Medications:  Current Outpatient Medications:    fluticasone  (FLONASE ) 50 MCG/ACT nasal spray, Place 2 sprays into both nostrils daily., Disp: 16 g, Rfl: 0   amoxicillin -clavulanate (AUGMENTIN ) 875-125 MG tablet, Take 1 tablet by mouth 2 (two) times daily (morning and evening) for 7 days., Disp: 14 tablet,  Rfl: 0   ibuprofen  (ADVIL ) 800 MG tablet, Take 1 tablet (800 mg total) by mouth every 8 (eight) hours as needed for mild pain for up to 5 days., Disp: 15 tablet, Rfl: 0   ipratropium (ATROVENT ) 0.03 % nasal spray, Place 2 sprays into both nostrils every 12 (twelve) hours., Disp: 30 mL, Rfl: 0   levonorgestrel   (KYLEENA ) 19.5 MG IUD, 1 each by Intrauterine route once., Disp: , Rfl:    ondansetron  (ZOFRAN ) 4 MG tablet, Take 1 tablet (4 mg total) by mouth every 8 (eight) hours as needed for nausea or vomiting., Disp: 20 tablet, Rfl: 1   sertraline  (ZOLOFT ) 50 MG tablet, Take 1/2 tablet daily for 7 days then increase to 1 full tablet daily., Disp: 30 tablet, Rfl: 3   valACYclovir  (VALTREX ) 1000 MG tablet, Take 1 tablet (1,000 mg total) by mouth 2 (two) times daily as needed., Disp: 30 tablet, Rfl: 1  Observations/Objective: Patient is well-developed, well-nourished in no acute distress.  Resting comfortably at home.  Head is normocephalic, atraumatic.  No labored breathing.  Speech is clear and coherent with logical content.  Patient is alert and oriented at baseline.    Assessment and Plan: 1. Acute viral sinusitis (Primary) - fluticasone  (FLONASE ) 50 MCG/ACT nasal spray; Place 2 sprays into both nostrils daily.  Dispense: 16 g; Refill: 0  - Suspect viral sinusitis - Symptomatic medications of choice over the counter as needed - Flonase  added - Push fluids - Rest - Seek further evaluation if symptoms change or worsen   Follow Up Instructions: I discussed the assessment and treatment plan with the patient. The patient was provided an opportunity to ask questions and all were answered. The patient agreed with the plan and demonstrated an understanding of the instructions.  A copy of instructions were sent to the patient via MyChart unless otherwise noted below.    The patient was advised to call back or seek an in-person evaluation if the symptoms worsen or if the condition fails to improve as anticipated.    Delon CHRISTELLA Dickinson, PA-C

## 2024-03-23 NOTE — Telephone Encounter (Signed)
 I printed off the results and gave to Tajai to scan and send to patient 03/22/2024.

## 2024-03-23 NOTE — Patient Instructions (Signed)
 Teresa Howell, thank you for joining Delon CHRISTELLA Dickinson, PA-C for today's virtual visit.  While this provider is not your primary care provider (PCP), if your PCP is located in our provider database this encounter information will be shared with them immediately following your visit.   A [facility] MyChart account gives you access to today's visit and all your visits, tests, and labs performed at [facility]  click here if you don't have a [facility] MyChart account or go to [email or web address]  Consent: (Patient) Teresa Howell provided verbal consent for this virtual visit at the beginning of the encounter.  Current Medications:  Current Outpatient Medications:    fluticasone  (FLONASE ) 50 MCG/ACT nasal spray, Place 2 sprays into both nostrils daily., Disp: 16 g, Rfl: 0   amoxicillin -clavulanate (AUGMENTIN ) 875-125 MG tablet, Take 1 tablet by mouth 2 (two) times daily (morning and evening) for 7 days., Disp: 14 tablet, Rfl: 0   ibuprofen  (ADVIL ) 800 MG tablet, Take 1 tablet (800 mg total) by mouth every 8 (eight) hours as needed for mild pain for up to 5 days., Disp: 15 tablet, Rfl: 0   ipratropium (ATROVENT ) 0.03 % nasal spray, Place 2 sprays into both nostrils every 12 (twelve) hours., Disp: 30 mL, Rfl: 0   levonorgestrel  (KYLEENA ) 19.5 MG IUD, 1 each by Intrauterine route once., Disp: , Rfl:    ondansetron  (ZOFRAN ) 4 MG tablet, Take 1 tablet (4 mg total) by mouth every 8 (eight) hours as needed for nausea or vomiting., Disp: 20 tablet, Rfl: 1   sertraline  (ZOLOFT ) 50 MG tablet, Take 1/2 tablet daily for 7 days then increase to 1 full tablet daily., Disp: 30 tablet, Rfl: 3   valACYclovir  (VALTREX ) 1000 MG tablet, Take 1 tablet (1,000 mg total) by mouth 2 (two) times daily as needed., Disp: 30 tablet, Rfl: 1   Medications ordered in this encounter:  Meds ordered this encounter  Medications   fluticasone  (FLONASE ) 50 MCG/ACT nasal spray    Sig: Place 2 sprays into  both nostrils daily.    Dispense:  16 g    Refill:  0    Supervising Provider:   BLAISE ALEENE KIDD [[id number]]     *If you need refills on other medications prior to your next appointment, please contact your pharmacy*  Follow-Up: Call back or seek an in-person evaluation if the symptoms worsen or if the condition fails to improve as anticipated.  [facility] [phone]  Other Instructions  Sinus Pain  Sinus pain may occur when your sinuses become clogged or swollen. Sinuses are air-filled spaces in your skull that are behind the bones of your face and forehead. Sinus pain can range from mild to severe. What are the causes? Sinus pain can result from various conditions that affect the sinuses. Common causes include: Colds. Sinus infections. Allergies. What are the signs or symptoms? The main symptom of this condition is pain or pressure in your face, forehead, ears, or upper teeth. People who have sinus pain often have other symptoms, such as: Congested or runny nose. Fever. Inability to smell. Headache. Weather changes can make symptoms worse. How is this diagnosed? Your health care provider will diagnose this condition based on your symptoms and a physical exam. If you have pain that keeps coming back or does not go away, your health care provider may recommend more testing. This may include: Imaging tests, such as a CT scan or MRI, to check for problems with your sinuses. Examination  of your sinuses using a thin tool with a camera that is inserted through your nose (endoscopy). How is this treated? Treatment for this condition depends on the cause. Sinus pain that is caused by a sinus infection may be treated with antibiotic medicine. Sinus pain that is caused by congestion may be helped by rinsing out (flushing) the nose and sinuses with saline solution. Sinus pain that is caused by allergies may be helped by allergy medicines (antihistamines) and medicated  nasal sprays. Sinus surgery may be needed in some cases if other treatments do not help. Follow these instructions at home: General instructions If directed: Apply a warm, moist washcloth to your face to help relieve pain. Use a nasal saline wash. Follow the directions on the bottle or box. Hydrate and humidify Drink enough water to keep your urine clear or pale yellow. Staying hydrated will help to thin your mucus. Use a humidifier if your home is dry. Inhale steam for 10-15 minutes, 3-4 times a day or as told by your health care provider. You can do this in the bathroom while a hot shower is running. Limit your exposure to cool or dry air. Medicines  Take over-the-counter and prescription medicines only as told by your health care provider. If you were prescribed an antibiotic medicine, take it as told by your health care provider. Do not stop taking the antibiotic even if you start to feel better. If you have congestion, use a nasal spray to help lessen pressure. Contact a health care provider if: You have sinus pain more than one time a week. You have sensitivity to light or sound. You develop a fever. You feel nauseous or you vomit. Your sinus pain or headache does not get better with treatment. Get help right away if: You have vision problems. You have sudden, severe pain in your face or head. You have a seizure. You are confused. You have a stiff neck. Summary Sinus pain occurs when your sinuses become clogged or swollen. Sinus pain can result from various conditions that affect the sinuses, such as a cold, a sinus infection, or an allergy. Treatment for this condition depends on the cause. It may include medicine, such as antibiotics or antihistamines. This information is not intended to replace advice given to you by your health care provider. Make sure you discuss any questions you have with your health care provider. Document Revised: 05/26/2021 Document Reviewed:  05/26/2021 Elsevier Patient Education  2024 Elsevier Inc.   If you have been instructed to have an in-person evaluation today at a local Urgent Care facility, please use the link below. It will take you to a list of all of our available [facility], including address, phone number and hours of operation. Please do not delay care.  [facility]  If you or a family member do not have a primary care provider, use the link below to schedule a visit and establish care. When you choose a [facility] primary care physician or advanced practice provider, you gain a long-term partner in health. Find a Primary Care Provider  Learn more about [facility]'s in-office and virtual care options: [facility] [facility] - Get Care Now

## 2024-03-23 NOTE — Telephone Encounter (Signed)
 Virtual Urgent Care appointment made for today 03/23/2024 at 12:45PM. No availability in PCP office or surrounding offices.   FYI Only or Action Required?: Action required by provider: update on patient condition.  Patient was last seen in primary care on 09/29/2023 by Willo Mini, NP.  Called Nurse Triage reporting Facial Pain.  Symptoms began 3 days ago.  Interventions attempted: OTC medications: ibuprofen  and Rest, hydration, or home remedies.  Symptoms are: unchanged.  Triage Disposition: See HCP Within 4 Hours (Or PCP Triage)  Patient/caregiver understands and will follow disposition?: Yes---Virtual Urgent Care scheduled              Copied from CRM #[id number]. Topic: Clinical - Red Word Triage >> Mar 23, 2024 11:16 AM Cherylann RAMAN wrote: Red Word that prompted transfer to Nurse Triage: Patient called in with complaints of having a low grade fever on Monday morning and was gone by Tuesday morning. Patient also expressed having congestion, headaches, and body aches. Patient states that her headaches began on Monday 09/15 and she has been taking OTC Ibuprofen  and it that has been helping. The headaches comes back after a couple of hours. She believes the headaches are due to the pressure build up of her sinuses. Reason for Disposition  [1] SEVERE sinus pain (e.g., excruciating) AND [2] not improved 2 hours after pain medicine  Answer Assessment - Initial Assessment Questions 1. LOCATION: Where does it hurt?      Sinuses, head 2. ONSET: When did the sinus pain start?  (e.g., hours, days)      3 days ago 3. SEVERITY: How bad is the pain?   (Scale 0-10; or none, mild, moderate or severe)     congested 4. RECURRENT SYMPTOM: Have you ever had sinus problems before? If Yes, ask: When was the last time? and What happened that time?      ---- 5. NASAL CONGESTION: Is the nose blocked? If Yes, ask: Can you open it or must you breathe through your mouth?     Congested  yes 6. NASAL DISCHARGE: Do you have discharge from your nose? If so ask, What color?     Clear and maybe a little light green 7. FEVER: Do you have a fever? If Yes, ask: What is it, how was it measured, and when did it start?      Low grade for the past few days---hasn't checked today 8. OTHER SYMPTOMS: Do you have any other symptoms? (e.g., sore throat, cough, earache, difficulty breathing)     Body aches 9. PREGNANCY: Is there any chance you are pregnant? When was your last menstrual period?     There's a chance but I shouldn't be, I have an IUD  Protocols used: Sinus Pain or Congestion-A-AH

## 2024-03-23 NOTE — Telephone Encounter (Signed)
 Patient seen today for ondemand visit

## 2024-03-25 ENCOUNTER — Other Ambulatory Visit (HOSPITAL_BASED_OUTPATIENT_CLINIC_OR_DEPARTMENT_OTHER): Payer: Self-pay

## 2024-03-29 ENCOUNTER — Encounter: Payer: Self-pay | Admitting: Medical-Surgical

## 2024-03-29 ENCOUNTER — Ambulatory Visit: Admitting: Medical-Surgical

## 2024-03-29 VITALS — BP 98/63 | HR 86 | Resp 20 | Ht 67.51 in | Wt 226.0 lb

## 2024-03-29 DIAGNOSIS — Z118 Encounter for screening for other infectious and parasitic diseases: Secondary | ICD-10-CM | POA: Diagnosis not present

## 2024-03-29 DIAGNOSIS — A6000 Herpesviral infection of urogenital system, unspecified: Secondary | ICD-10-CM

## 2024-03-29 DIAGNOSIS — F418 Other specified anxiety disorders: Secondary | ICD-10-CM | POA: Diagnosis not present

## 2024-03-29 MED ORDER — DESVENLAFAXINE SUCCINATE ER 50 MG PO TB24: 50.0000 mg | ORAL_TABLET | Freq: Every day | ORAL | 1 refills | Status: DC

## 2024-03-29 MED ORDER — VALACYCLOVIR HCL 1 G PO TABS: 1000.0000 mg | ORAL_TABLET | Freq: Two times a day (BID) | ORAL | 1 refills | Status: AC | PRN

## 2024-03-29 NOTE — Assessment & Plan Note (Signed)
 Episodes of anxiety and mood instability. Concerns about weight gain with sertraline . Previous Wellbutrin and Lexapro  trials were unsatisfactory. - Discontinue sertraline . - Initiate Pristiq  50 mg daily. - Schedule follow-up in 4-6 weeks, virtual appointment preferred.

## 2024-03-29 NOTE — Progress Notes (Signed)
        Established patient visit   History of Present Illness   Discussed the use of AI scribe software for clinical note transcription with the patient, who gave verbal consent to proceed.  History of Present Illness   Teresa Howell is a 23 year old female who presents for medication management and follow-up.  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  Constitutional:      General: She is not in acute distress.    Appearance: Normal appearance.  HENT:     Head: Normocephalic and atraumatic.  Cardiovascular:     Rate and Rhythm: Normal rate and regular rhythm.     Pulses: Normal pulses.     Heart sounds: Normal heart sounds. No murmur heard.    No friction rub. No gallop.  Pulmonary:     Effort: Pulmonary effort is normal. No respiratory distress.     Breath sounds: Normal breath sounds. No wheezing.  Skin:    General: Skin is warm and dry.  Neurological:     Mental Status: She is alert and oriented to person, place, and time.  Psychiatric:        Mood and Affect: Mood normal.        Behavior: Behavior normal.        Thought Content: Thought content normal.        Judgment: Judgment normal.    Assessment & Plan   Problem List Items Addressed This Visit       Genitourinary   Genital herpes simplex   Infrequent outbreaks, last six months ago. - Refill Valtrex  prescription for as-needed use.      Relevant Medications   valACYclovir  (VALTREX )  1000 MG tablet     Other   Depression with anxiety   Episodes of anxiety and mood instability. Concerns about weight gain with sertraline . Previous Wellbutrin and Lexapro  trials were unsatisfactory. - Discontinue sertraline . - Initiate Pristiq  50 mg daily. - Schedule follow-up in 4-6 weeks, virtual appointment preferred.      Relevant Medications   desvenlafaxine  (PRISTIQ ) 50 MG 24 hr tablet   Other Visit Diagnoses       Screening for chlamydial disease    -  Primary   Relevant Orders   GC/Chlamydia Probe Amp      General Health Maintenance Due for annual chlamydia screening. - Perform urine test for chlamydia screening.  Follow up   Return in about 4 weeks (around 04/26/2024) for mood follow up (ok to be virtual). __________________________________ Zada FREDRIK Palin, DNP, APRN, FNP-BC Primary Care and Sports Medicine [facility] [place]

## 2024-03-29 NOTE — Assessment & Plan Note (Signed)
 Infrequent outbreaks, last six months ago. - Refill Valtrex  prescription for as-needed use.

## 2024-04-02 ENCOUNTER — Ambulatory Visit: Payer: Self-pay | Admitting: Medical-Surgical

## 2024-04-02 LAB — SPECIMEN STATUS REPORT

## 2024-04-02 LAB — GC/CHLAMYDIA PROBE AMP
Chlamydia trachomatis, NAA: NEGATIVE
Neisseria Gonorrhoeae by PCR: NEGATIVE

## 2024-04-18 ENCOUNTER — Encounter: Payer: Self-pay | Admitting: Medical-Surgical

## 2024-04-26 ENCOUNTER — Telehealth (INDEPENDENT_AMBULATORY_CARE_PROVIDER_SITE_OTHER): Admitting: Medical-Surgical

## 2024-04-26 DIAGNOSIS — F418 Other specified anxiety disorders: Secondary | ICD-10-CM | POA: Diagnosis not present

## 2024-04-26 MED ORDER — DESVENLAFAXINE SUCCINATE ER 50 MG PO TB24
50.0000 mg | ORAL_TABLET | Freq: Every day | ORAL | 3 refills | Status: AC
  Filled 2024-05-10: qty 90, 90d supply, fill #0

## 2024-04-26 NOTE — Progress Notes (Signed)
 Virtual Visit via Video Note  I connected with Teresa Howell on 04/26/24 at  2:00 PM EDT by a video enabled telemedicine application and verified that I am speaking with the correct person using two identifiers.   I discussed the limitations of evaluation and management by telemedicine and the availability of in person appointments. The patient expressed understanding and agreed to proceed.  Patient location: home Provider locations: office  Subjective:    Discussed the use of AI scribe software for clinical note transcription with the patient, who gave verbal consent to proceed.  Teresa Howell is a 23 year old female who presents for follow-up regarding her medication for stress management.  Psychological stress and medication management - Taking Pristiq  50mg  daily for stress management in the morning before work for four weeks - Experiencing reduced stress levels since starting medication - No nausea  - No thoughts of self-harm or harm to others  Headache - Intermittent headaches over the last few days - Uncertain if headaches are related to medication  Past medical history, Surgical history, Family history not pertinant except as noted below, Social history, Allergies, and medications have been entered into the medical record, reviewed, and corrections made.   Review of Systems: See HPI for pertinent positives and negatives.   Objective:    General: Speaking clearly in complete sentences without any shortness of breath.  Alert and oriented x3.  Normal judgment. No apparent acute distress.  Impression and Recommendations:    Depression with anxiety Depression with anxiety well-managed with Pristiq . No self-harm thoughts. Medication tolerated well. Low suspicion that headache over the last couple of days is medication related. - Continue Pristiq  50mg  daily. - Schedule follow-up in six months unless needed sooner.  I discussed the assessment and treatment plan with the  patient. The patient was provided an opportunity to ask questions and all were answered. The patient agreed with the plan and demonstrated an understanding of the instructions.   The patient was advised to call back or seek an in-person evaluation if the symptoms worsen or if the condition fails to improve as anticipated.  Return in about 6 months (around 10/25/2024) for mood follow up.  Zada FREDRIK Palin, DNP, APRN, FNP-BC [facility] and Sports Medicine

## 2024-05-10 ENCOUNTER — Other Ambulatory Visit (HOSPITAL_BASED_OUTPATIENT_CLINIC_OR_DEPARTMENT_OTHER): Payer: Self-pay

## 2024-05-11 ENCOUNTER — Encounter: Payer: Self-pay | Admitting: Family Medicine

## 2024-05-11 ENCOUNTER — Ambulatory Visit: Payer: Self-pay

## 2024-05-11 ENCOUNTER — Ambulatory Visit: Admitting: Family Medicine

## 2024-05-11 VITALS — BP 122/78 | HR 97 | Temp 97.9°F | Ht 67.51 in | Wt 226.6 lb

## 2024-05-11 DIAGNOSIS — R051 Acute cough: Secondary | ICD-10-CM

## 2024-05-11 DIAGNOSIS — J069 Acute upper respiratory infection, unspecified: Secondary | ICD-10-CM

## 2024-05-11 LAB — POCT INFLUENZA A/B
Influenza A, POC: NEGATIVE
Influenza B, POC: NEGATIVE

## 2024-05-11 LAB — POC COVID19 BINAXNOW: SARS Coronavirus 2 Ag: NEGATIVE

## 2024-05-11 NOTE — Telephone Encounter (Signed)
 FYI Only or Action Required?: FYI only for provider: appointment scheduled on 11/5 at [facility].  Patient was last seen in primary care on 04/26/2024 by Willo Mini, NP.  Called Nurse Triage reporting Sinusitis.  Symptoms began several days ago.  Interventions attempted: Nothing.  Symptoms are: gradually worsening.  Triage Disposition: See Physician Within 24 Hours  Patient/caregiver understands and will follow disposition?: Yes- requesting OV today close to her job. Appt scheduled at [facility]  Copied from CRM [id number]. Topic: Clinical - Red Word Triage >> May 11, 2024  9:11 AM Amy B wrote: Red Word that prompted transfer to Nurse Triage: Fever, cough, dizziness Reason for Disposition  Earache    Dizziness when changing head positions and ears popping  Answer Assessment - Initial Assessment Questions 1. LOCATION: Where does it hurt?      Not too much pain, but has an occasional headache  2. ONSET: When did the sinus pain start?  (e.g., hours, days)      3 days now  3. SEVERITY: How bad is the pain?   (Scale 0-10; or none, mild, moderate or severe)     Mild to moderate  4. RECURRENT SYMPTOM: Have you ever had sinus problems before? If Yes, ask: When was the last time? and What happened that time?      Yes, has had sinus problems before. Last time she had a sinus infection and was prescribed abx  5. NASAL CONGESTION: Is the nose blocked? If Yes, ask: Can you open it or must you breathe through your mouth?     Yes, having trouble breathing thru nose  6. NASAL DISCHARGE: Do you have discharge from your nose? If so ask, What color?     Yes, clear or green  7. FEVER: Do you have a fever? If Yes, ask: What is it, how was it measured, and when did it start?      Taken an hour and half ago and it was 100.22F  8. OTHER SYMPTOMS: Do you have any other symptoms? (e.g., sore throat, cough, earache, difficulty breathing)     Dry and sometime productive  Cough, dizziness when changing head position, no taste and smell  9. PREGNANCY: Is there any chance you are pregnant? When was your last menstrual period?     Yes there is a chance, LMP October 22  Protocols used: Sinus Pain or Congestion-A-AH

## 2024-05-11 NOTE — Patient Instructions (Signed)
 Try over the counter mucinex DM as directed on the packaging. Also try over the counter saline nasal spray  throughout the day for clearing and moisturizing nasal passages.

## 2024-05-11 NOTE — Progress Notes (Signed)
 OFFICE VISIT  05/11/2024  CC:  Chief Complaint  Patient presents with   Cough    Pt also c/o fever and dizzineess for the past 3 days; dizziness when changing head positions and ears popping; Not too much pain, but has an occasional headache   Patient is a 23 y.o. female who presents for cough.  HPI: 3 days of nasal congestion, postnasal drip, cough, and ears feeling pressure and popping. Says temperature this morning was low-grade elevated but she is not sure if her thermometer was working or not.  She has been feeling more tendency to get dizzy/lightheaded with changes in body posture or turning of the head.  The sensation only lasts a few seconds. No face pain, headaches, nausea, vomiting, or diarrhea.  No rash.  No chest pain or shortness of breath or wheezing. She does not smoke cigarettes but she vapes. 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  A coworker recently had COVID.   Past Medical History:  Diagnosis Date   Herpes simplex type 1 infection    Herpes simplex type 2 infection    Otitis media    Throat pain     Past Surgical History:  Procedure Laterality Date   WISDOM TOOTH EXTRACTION      Outpatient Medications Prior to Visit  Medication Sig Dispense Refill   desvenlafaxine  (PRISTIQ ) 50 MG 24 hr tablet Take 1 tablet (50 mg total) by mouth daily. 90 tablet 3   levonorgestrel  (KYLEENA ) 19.5 MG IUD 1 each by Intrauterine route once.     valACYclovir  (VALTREX ) 1000 MG tablet Take 1 tablet (1,000 mg total) by mouth 2 (two) times daily as needed. 30 tablet 1   No facility-administered medications prior to visit.    Allergies  Allergen Reactions   Other Other (See Comments)    Allergy to cinnamon toothpaste caused blisters around the mouth    Review of Systems  As per HPI  PE:    05/11/2024   10:17 AM 03/29/2024    2:26 PM 08/11/2023   11:21 AM  Vitals with BMI  Height 5' 7.51 5' 7.51 5' 7.51  Weight 226 lbs 10 oz 226 lbs 195 lbs 14 oz  BMI  34.95 34.85 30.21  Systolic 122 98 110  Diastolic 78 63 73  Pulse 97 86 94   Physical Exam  VS: noted--normal. Gen: alert, NAD, NONTOXIC APPEARING. HEENT: eyes without injection, drainage, or swelling.  Ears: EACs clear, TMs with normal light reflex and landmarks.  Nose: Clear rhinorrhea, with some dried, crusty exudate adherent to mildly injected mucosa.  No purulent d/c.  No paranasal sinus TTP.  No facial swelling.  Throat and mouth without focal lesion.  No pharyngial swelling, erythema, or exudate.   Neck: supple, no LAD.   LUNGS: CTA bilat, nonlabored resps.   CV: RRR, no m/r/g. EXT: no c/c/e SKIN: no rash   LABS:  Last CBC Lab Results  Component Value Date   WBC 7.3 07/08/2022   HGB 12.6 07/08/2022   HCT 37.5 07/08/2022   MCV 86.4 07/08/2022   MCH 29.0 07/08/2022   RDW 12.2 07/08/2022   PLT 365 07/08/2022   Last metabolic panel Lab Results  Component Value Date   GLUCOSE 82 07/08/2022   NA 140 07/08/2022   K 4.0 07/08/2022   CL 104 07/08/2022   CO2 28 07/08/2022   BUN 9 07/08/2022   CREATININE 0.73 07/08/2022   EGFR 121 07/08/2022   CALCIUM 8.8 (L) 07/08/2022  PROT 7.2 07/08/2022   ALBUMIN 4.3 07/18/2013   BILITOT 0.4 07/08/2022   ALKPHOS 183 07/18/2013   AST 15 07/08/2022   ALT 10 07/08/2022   IMPRESSION AND PLAN:  Viral URI with cough. Rapid flu and COVID testing here today are both negative. Symptomatic care with Mucinex DM and saline nasal spray discussed today.  An After Visit Summary was printed and given to the patient.  FOLLOW UP: Return if symptoms worsen or fail to improve.  Signed:  [name], MD           05/11/2024

## 2024-05-24 DIAGNOSIS — H5212 Myopia, left eye: Secondary | ICD-10-CM | POA: Diagnosis not present

## 2024-05-24 DIAGNOSIS — H52223 Regular astigmatism, bilateral: Secondary | ICD-10-CM | POA: Diagnosis not present

## 2024-06-23 DIAGNOSIS — Z124 Encounter for screening for malignant neoplasm of cervix: Secondary | ICD-10-CM | POA: Diagnosis not present

## 2024-07-20 ENCOUNTER — Telehealth: Admitting: Emergency Medicine

## 2024-07-20 ENCOUNTER — Other Ambulatory Visit (HOSPITAL_BASED_OUTPATIENT_CLINIC_OR_DEPARTMENT_OTHER): Payer: Self-pay

## 2024-07-20 ENCOUNTER — Encounter: Payer: Self-pay | Admitting: Medical-Surgical

## 2024-07-20 DIAGNOSIS — B9689 Other specified bacterial agents as the cause of diseases classified elsewhere: Secondary | ICD-10-CM | POA: Diagnosis not present

## 2024-07-20 DIAGNOSIS — J019 Acute sinusitis, unspecified: Secondary | ICD-10-CM | POA: Diagnosis not present

## 2024-07-20 MED ORDER — AMOXICILLIN-POT CLAVULANATE 875-125 MG PO TABS
1.0000 | ORAL_TABLET | Freq: Two times a day (BID) | ORAL | 0 refills | Status: AC
  Filled 2024-07-20: qty 14, 7d supply, fill #0

## 2024-07-20 MED ORDER — AMOXICILLIN-POT CLAVULANATE 875-125 MG PO TABS: 1.0000 | ORAL_TABLET | Freq: Two times a day (BID) | ORAL | 0 refills | Status: DC

## 2024-07-20 NOTE — Progress Notes (Signed)
 "    E-Visit for Sinus Problems  We are sorry that you are not feeling well.  Here is how we plan to help!  I will send a work note for you through [place]  Based on what you have shared with me it looks like you have sinusitis.  Sinusitis is inflammation and infection in the sinus cavities of the head.  Based on your presentation I believe you most likely have Acute Bacterial Sinusitis.  This is an infection caused by bacteria and is treated with antibiotics. I have prescribed Augmentin  875mg /125mg  one tablet twice daily with food, for 7 days.   You may use an oral decongestant such as Mucinex D or if you have glaucoma or high blood pressure use plain Mucinex.   Saline nasal spray help and can safely be used as often as needed for congestion.  Try using saline irrigation, such as with a neti pot, several times a day while you are sick. Many neti pots come with salt packets premeasured to use to make saline. If you use your own salt, make sure it is kosher salt or sea salt (don't use table salt as it has iodine in it and you don't need that in your nose). Use distilled water to make saline. If you mix your own saline using your own salt, the recipe is 1/4 teaspoon salt in 1 cup warm water. Using saline irrigation can help prevent and treat sinus infections.   If you develop worsening sinus pain, fever or notice severe headache and vision changes, or if symptoms are not better after completion of antibiotic, please schedule an appointment with a health care provider.    Sinus infections are not as easily transmitted as other respiratory infection, however we still recommend that you avoid close contact with loved ones, especially the very young and elderly.  Remember to wash your hands thoroughly throughout the day as this is the number one way to prevent the spread of infection!  Home Care: Only take medications as instructed by your medical team. Complete the entire course of an antibiotic. Do not  take these medications with alcohol. A steam or ultrasonic humidifier can help congestion.  You can place a towel over your head and breathe in the steam from hot water coming from a faucet. Avoid close contacts especially the very young and the elderly. Cover your mouth when you cough or sneeze. Always remember to wash your hands.  Get Help Right Away If: You develop worsening fever or sinus pain. You develop a severe head ache or visual changes. Your symptoms persist after you have completed your treatment plan.  Make sure you Understand these instructions. Will watch your condition. Will get help right away if you are not doing well or get worse.  Your e-visit answers were reviewed by a board certified advanced clinical practitioner to complete your personal care plan.  Depending on the condition, your plan could have included both over the counter or prescription medications.  If there is a problem please reply  once you have received a response from your provider.  Your safety is important to us .  If you have drug allergies check your prescription carefully.    You can use MyChart to ask questions about todays visit, request a non-urgent call back, or ask for a work or school excuse for 24 hours related to this e-Visit. If it has been greater than 24 hours you will need to follow up with your provider, or enter a  new e-Visit to address those concerns.  You will get an e-mail in the next two days asking about your experience.  I hope that your e-visit has been valuable and will speed your recovery. Thank you for using e-visits.  I have spent 5 minutes in review of e-visit questionnaire, review and updating patient chart, medical decision making and response to patient.   Jon Belt, PhD, FNP-BC      "

## 2024-07-20 NOTE — Addendum Note (Signed)
 Addended by: Lulamae Skorupski M on: 07/20/2024 01:04 PM   Modules accepted: Orders
